# Patient Record
Sex: Female | Born: 1995 | Race: Black or African American | Hispanic: No | Marital: Married | Smoking: Never smoker
Health system: Southern US, Community
[De-identification: ages and names within clinical notes are randomized; demographics above are authoritative.]

---
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
## 2016-07-03 ENCOUNTER — Encounter (HOSPITAL_COMMUNITY): Payer: Self-pay

## 2016-07-03 ENCOUNTER — Emergency Department (HOSPITAL_COMMUNITY)
Admission: EM | Admit: 2016-07-03 | Discharge: 2016-07-03 | Disposition: A | Discharge status: Home / Self Care | Payer: Medicaid Other | Attending: Emergency Medicine | Admitting: Emergency Medicine

## 2016-07-03 DIAGNOSIS — Z79899 Other long term (current) drug therapy: Secondary | ICD-10-CM | POA: Insufficient documentation

## 2016-07-03 DIAGNOSIS — O219 Vomiting of pregnancy, unspecified: Secondary | ICD-10-CM | POA: Diagnosis not present

## 2016-07-03 DIAGNOSIS — O0281 Inappropriate change in quantitative human chorionic gonadotropin (hCG) in early pregnancy: Secondary | ICD-10-CM | POA: Insufficient documentation

## 2016-07-03 DIAGNOSIS — Z3A01 Less than 8 weeks gestation of pregnancy: Secondary | ICD-10-CM | POA: Diagnosis not present

## 2016-07-03 DIAGNOSIS — Z3491 Encounter for supervision of normal pregnancy, unspecified, first trimester: Secondary | ICD-10-CM

## 2016-07-03 LAB — HCG, QUANTITATIVE, PREGNANCY: HCG, BETA CHAIN, QUANT, S: 164335 m[IU]/mL — AB (ref <5)

## 2016-07-03 MED ORDER — DOXYLAMINE-PYRIDOXINE 10-10 MG PO TBEC: 1.0000 | DELAYED_RELEASE_TABLET | Freq: Two times a day (BID) | ORAL | 0 refills | Status: AC | PRN

## 2016-07-03 MED ORDER — PRENATAL COMPLETE 14-0.4 MG PO TABS: 1.0000 | ORAL_TABLET | Freq: Every day | ORAL | 3 refills | Status: AC

## 2016-07-03 NOTE — ED Provider Notes (Signed)
[facility]-EMERGENCY DEPT Provider Note   CSN: [id number] Arrival date & time: 07/03/16  1104   By signing my name below, I, Soijett Blue, attest that this documentation has been prepared under the direction and in the presence of Shaune PollackIsaacs, Williom Cedar, MD. Electronically Signed: Soijett Blue, ED Scribe. 07/03/16. 12:36 PM.  History   Chief Complaint Chief Complaint  Patient presents with  . Threatened Miscarriage    HPI Sierra Welch is a 21 y.o. female who presents to the Emergency Department complaining of pregnancy issues onset 1 week ago. Pt reports associated resolved nausea and resolved vomiting. Pt has not tried any medications for the relief of her symptoms. She reports that she normally has irregular non-painful periods and began OCP on 05/08/2016 with no menstrual cycle at the end of her OCP pack. Pt states that this is her first time taking OCP and she took 2 home pregnancy test that resulted positive approximately 1 month ago. She states that at 8 weeks she went to [facility] and inconclusive US results and that this is her first pregnancy. She notes that she should be approximately [redacted] weeks pregnant, but she is unsure if she is still pregnant due to having no pregnancy symptoms at this time. 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 She denies abdominal pain, vaginal bleeding, fever, chills, and any other symptoms.     The history is provided by the patient and a significant other. No language interpreter was used.    History reviewed. No pertinent past medical history.  There are no active problems to display for this patient.   History reviewed. No pertinent surgical history.  OB History    Gravida Para Term Preterm AB Living   1             SAB TAB Ectopic Multiple Live Births                   Home Medications    Prior to Admission medications   Medication Sig Start Date End Date Taking? Authorizing Provider    Doxylamine-Pyridoxine 10-10 MG TBEC Take 1 tablet by mouth 2 (two) times daily as needed (nausea, vomiting). 07/03/16   Shaune PollackIsaacs, Craige Patel, MD  Prenatal Vit-Fe Fumarate-FA (PRENATAL COMPLETE) 14-0.4 MG TABS Take 1 tablet by mouth daily. 07/03/16   Shaune PollackIsaacs, Kaelynne Christley, MD    Family History History reviewed. No pertinent family history.  Social History Social History  Substance Use Topics  . Smoking status: Never Smoker  . Smokeless tobacco: Never Used  . Alcohol use No     Allergies   Patient has no known allergies.   Review of Systems Review of Systems  Constitutional: Negative for chills and fever.  Gastrointestinal: Positive for nausea (resolved) and vomiting (resolved). Negative for abdominal pain.  Genitourinary: Negative for vaginal bleeding.  Psychiatric/Behavioral: Decreased concentration: .scribe.  All other systems reviewed and are negative.    Physical Exam Updated Vital Signs BP 114/78 (BP Location: Left Arm)   Pulse (!) 112   Resp 20   LMP 04/24/2016   SpO2 100%   Physical Exam  Constitutional: She is oriented to person, place, and time. She appears well-developed and well-nourished. No distress.  HENT:  Head: Normocephalic and atraumatic.  Eyes: EOM are normal.  Neck: Neck supple.  Cardiovascular: Normal rate, regular rhythm and normal heart sounds.  Exam reveals no gallop and no friction rub.   No murmur heard. Pulmonary/Chest: Effort normal and breath sounds  normal. No respiratory distress. She has no wheezes. She has no rales.  Abdominal: Soft. She exhibits no distension. There is no tenderness.  Musculoskeletal: Normal range of motion.  Neurological: She is alert and oriented to person, place, and time.  Skin: Skin is warm and dry.  Psychiatric: She has a normal mood and affect. Her behavior is normal.  Nursing note and vitals reviewed.    ED Treatments / Results   DIAGNOSTIC STUDIES: Oxygen Saturation is 100% on RA, nl by my interpretation.     COORDINATION OF CARE: 12:35 PM Discussed treatment plan with pt at bedside which includes bedside US and pt agreed to plan.   Labs (all labs ordered are listed, but only abnormal results are displayed) Labs Reviewed  HCG, QUANTITATIVE, PREGNANCY - Abnormal; Notable for the following:       Result Value   hCG, Beta Chain, Quant, Kathie Rhodes [phone] (*)    All other components within normal limits    EKG  EKG Interpretation None       Radiology No results found.  Procedures [place] bedside Date/Time: 07/03/2016 12:25 PM Performed by: Shaune Pollack Authorized by: Shaune Pollack  Consent: Verbal consent obtained. Risks and benefits: risks, benefits and alternatives were discussed Consent given by: patient Patient understanding: patient states understanding of the procedure being performed Relevant documents: relevant documents present and verified Patient identity confirmed: verbally with patient and hospital-assigned identification number Time out: Immediately prior to procedure a "time out" was called to verify the correct patient, procedure, equipment, support staff and site/side marked as required. Local anesthesia used: no  Anesthesia: Local anesthesia used: no  Sedation: Patient sedated: no Patient tolerance: Patient tolerated the procedure well with no immediate complications    (including critical care time)  Medications Ordered in ED Medications - No data to display   Initial Impression / Assessment and Plan / ED Course  I have reviewed the triage vital signs and the nursing notes.  Pertinent labs & imaging results that were available during my care of the patient were reviewed by me and considered in my medical decision making (see chart for details).      21 year old female here to assess whether she is still pregnant. She believes she is [redacted] weeks pregnant based on ultrasound performed by [facility]. No abdominal pain, vaginal bleeding, or vaginal  discharge. On my exam, abdomen is soft and nontender. I suspect her resolution of nausea and vomiting is secondary to progression through the first trimester. She does not appear dehydrated. Bedside ultrasound confirms positive fetal heart tones and given absence of any abdominal pain, bleeding, or other evidence of pregnancy-related competition, do not feel further workup is indicated. I discussed the natural progression of pregnancy with the patient will discharge home.  Final Clinical Impressions(s) / ED Diagnoses   Final diagnoses:  First trimester pregnancy    New Prescriptions Discharge Medication List as of 07/03/2016 12:49 PM    START taking these medications   Details  Doxylamine-Pyridoxine 10-10 MG TBEC Take 1 tablet by mouth 2 (two) times daily as needed (nausea, vomiting)., Starting Tue 07/03/2016, Print    Prenatal Vit-Fe Fumarate-FA (PRENATAL COMPLETE) 14-0.4 MG TABS Take 1 tablet by mouth daily., Starting Tue 07/03/2016, Print         I personally performed the services described in this documentation, which was scribed in my presence. The recorded information has been reviewed and is accurate.     Shaune Pollack, MD 07/03/16 2131

## 2016-07-03 NOTE — ED Triage Notes (Signed)
Pt states she began taking birth control in April, then missed her period. She took 2 preg test at home which were both positive but is now concerned for miscarriage because her pregnancy symptoms have gone away (morning sickness). Pt denies abd pain or any bleeding.

## 2017-10-20 ENCOUNTER — Emergency Department (HOSPITAL_BASED_OUTPATIENT_CLINIC_OR_DEPARTMENT_OTHER): Payer: 59

## 2017-10-20 ENCOUNTER — Other Ambulatory Visit: Payer: Self-pay

## 2017-10-20 ENCOUNTER — Encounter (HOSPITAL_BASED_OUTPATIENT_CLINIC_OR_DEPARTMENT_OTHER): Payer: Self-pay

## 2017-10-20 ENCOUNTER — Emergency Department (HOSPITAL_BASED_OUTPATIENT_CLINIC_OR_DEPARTMENT_OTHER)
Admission: EM | Admit: 2017-10-20 | Discharge: 2017-10-20 | Disposition: A | Discharge status: Home / Self Care | Payer: 59 | Attending: Emergency Medicine | Admitting: Emergency Medicine

## 2017-10-20 DIAGNOSIS — M25511 Pain in right shoulder: Secondary | ICD-10-CM | POA: Diagnosis not present

## 2017-10-20 DIAGNOSIS — Z79899 Other long term (current) drug therapy: Secondary | ICD-10-CM | POA: Insufficient documentation

## 2017-10-20 DIAGNOSIS — R079 Chest pain, unspecified: Secondary | ICD-10-CM | POA: Insufficient documentation

## 2017-10-20 DIAGNOSIS — M7918 Myalgia, other site: Secondary | ICD-10-CM | POA: Diagnosis not present

## 2017-10-20 MED ORDER — IBUPROFEN 400 MG PO TABS
600.0000 mg | ORAL_TABLET | Freq: Once | ORAL | Status: AC
  Administered 2017-10-20: 600 mg via ORAL
  Filled 2017-10-20: qty 1

## 2017-10-20 NOTE — ED Triage Notes (Signed)
Pt c/o R shoulder and chest pain x3 days. Pt states "it feels like a pulled muscle." Pt reports some associated ShOB.

## 2017-10-20 NOTE — ED Provider Notes (Signed)
[facility] EMERGENCY DEPARTMENT Provider Note   CSN: [id number] Arrival date & time: 10/20/17  1948     History   Chief Complaint Chief Complaint  Patient presents with  . Chest Pain    HPI Sierra Welch is a 22 y.o. female.  HPI 22 year old healthy female presents the emergency department with right anterior chest discomfort and right trapezius and shoulder discomfort that has been constant over the past 3 days.  No known injury or trauma but she does have a 6367-month-old pitbull that she takes on walks and polls consistently at the least.  She states it is difficult to manage him with the leash which she tends to hold in her right hand.  No productive cough.  No fevers or chills.  No shortness of breath.  No unilateral leg swelling.  Symptoms are mild to moderate in severity.  She is tried Tylenol with some improvement but not complete improvement of her symptoms.   History reviewed. No pertinent past medical history.  There are no active problems to display for this patient.   History reviewed. No pertinent surgical history.   OB History    Gravida  1   Para      Term      Preterm      AB      Living        SAB      TAB      Ectopic      Multiple      Live Births               Home Medications    Prior to Admission medications   Medication Sig Start Date End Date Taking? Authorizing Provider  Doxylamine-Pyridoxine 10-10 MG TBEC Take 1 tablet by mouth 2 (two) times daily as needed (nausea, vomiting). 07/03/16   Shaune PollackIsaacs, Cameron, MD  Prenatal Vit-Fe Fumarate-FA (PRENATAL COMPLETE) 14-0.4 MG TABS Take 1 tablet by mouth daily. 07/03/16   Shaune PollackIsaacs, Cameron, MD    Family History No family history on file.  Social History Social History   Tobacco Use  . Smoking status: Never Smoker  . Smokeless tobacco: Never Used  Substance Use Topics  . Alcohol use: No  . Drug use: Never     Allergies   Patient has no known allergies.   Review  of Systems Review of Systems  All other systems reviewed and are negative.    Physical Exam Updated Vital Signs BP 137/74 (BP Location: Left Arm)   Pulse 98   Temp 98.3 F (36.8 C) (Oral)   Resp 18   Ht 5\' 6"  (1.676 m)   Wt 56.7 kg   LMP 10/01/2017   SpO2 100%   Breastfeeding? Unknown   BMI 20.18 kg/m   Physical Exam  Constitutional: She is oriented to person, place, and time. She appears well-developed and well-nourished. No distress.  HENT:  Head: Normocephalic and atraumatic.  Eyes: EOM are normal.  Neck: Normal range of motion.  Cardiovascular: Normal rate, regular rhythm and normal heart sounds.  Pulmonary/Chest: Effort normal and breath sounds normal.  Abdominal: Soft. She exhibits no distension. There is no tenderness.  Musculoskeletal: Normal range of motion.  Full range of motion of right shoulder, right elbow, right wrist.  Normal right radial pulse.  Normal grip strength right hand.  Some tenderness along the right trapezius and scapular region without rash.  No significant anterior chest tenderness.  Neurological: She is alert and oriented to person, place,  and time.  Skin: Skin is warm and dry.  Psychiatric: She has a normal mood and affect. Judgment normal.  Nursing note and vitals reviewed.    ED Treatments / Results  Labs (all labs ordered are listed, but only abnormal results are displayed) Labs Reviewed - No data to display  EKG EKG Interpretation  Date/Time:  Sunday October 20 2017 19:57:21 EDT Ventricular Rate:  109 PR Interval:  136 QRS Duration: 74 QT Interval:  334 QTC Calculation: 449 R Axis:   97 Text Interpretation:  Sinus tachycardia Rightward axis Borderline ECG No old tracing to compare Confirmed by Azalia Bilis ([id number]) on 10/20/2017 9:19:25 PM   Radiology Dg Chest 2 View  Result Date: 10/20/2017 CLINICAL DATA:  Patient with shoulder and chest pain. EXAM: CHEST - 2 VIEW COMPARISON:  None. FINDINGS: Normal cardiac and  mediastinal contours. No consolidative pulmonary opacities. No pleural effusion or pneumothorax. IMPRESSION: No acute cardiopulmonary process. Electronically Signed   By: Annia Belt M.D.   On: 10/20/2017 21:02    Procedures Procedures (including critical care time)  Medications Ordered in ED Medications  ibuprofen (ADVIL,MOTRIN) tablet 600 mg (has no administration in time range)     Initial Impression / Assessment and Plan / ED Course  I have reviewed the triage vital signs and the nursing notes.  Pertinent labs & imaging results that were available during my care of the patient were reviewed by me and considered in my medical decision making (see chart for details).     Pain seems to be more musculoskeletal nature.  EKG without ischemic changes.  Chest x-ray negative.  Doubt PE.  Doubt dissection.  No signs to suggest acute coronary syndrome.  No signs of pneumothorax on chest x-ray.  Recommend ibuprofen and heat for symptoms  Final Clinical Impressions(s) / ED Diagnoses   Final diagnoses:  Chest pain, unspecified type  Musculoskeletal pain    ED Discharge Orders    None       Azalia Bilis, MD 10/20/17 2126

## 2019-03-27 IMAGING — CR DG CHEST 2V
2 series · 2 of 2 positions shown · non-contrast
Comparison: None.

CLINICAL DATA: Patient with shoulder and chest pain.

EXAM:
CHEST - 2 VIEW

[w chest pa]
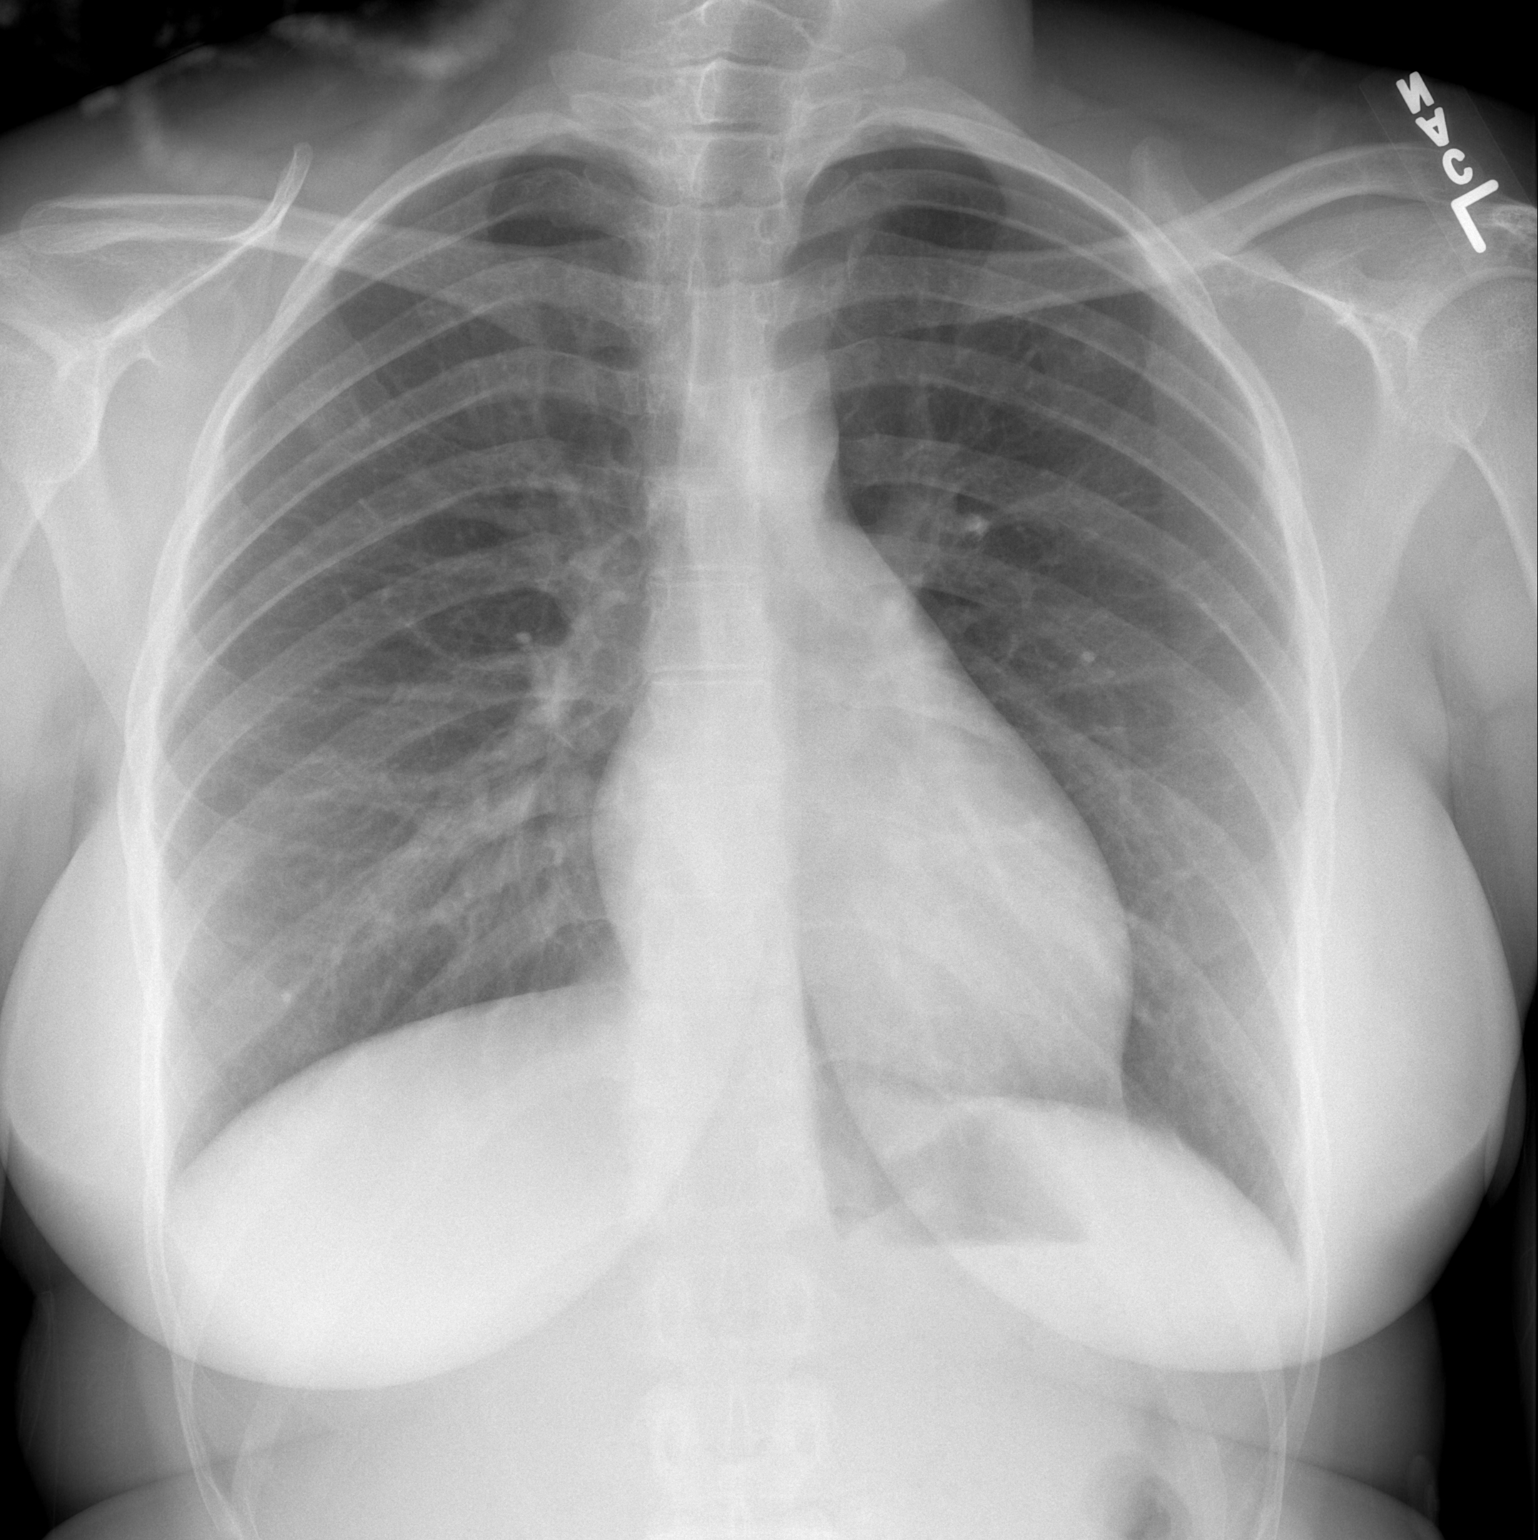

[w chest lat]
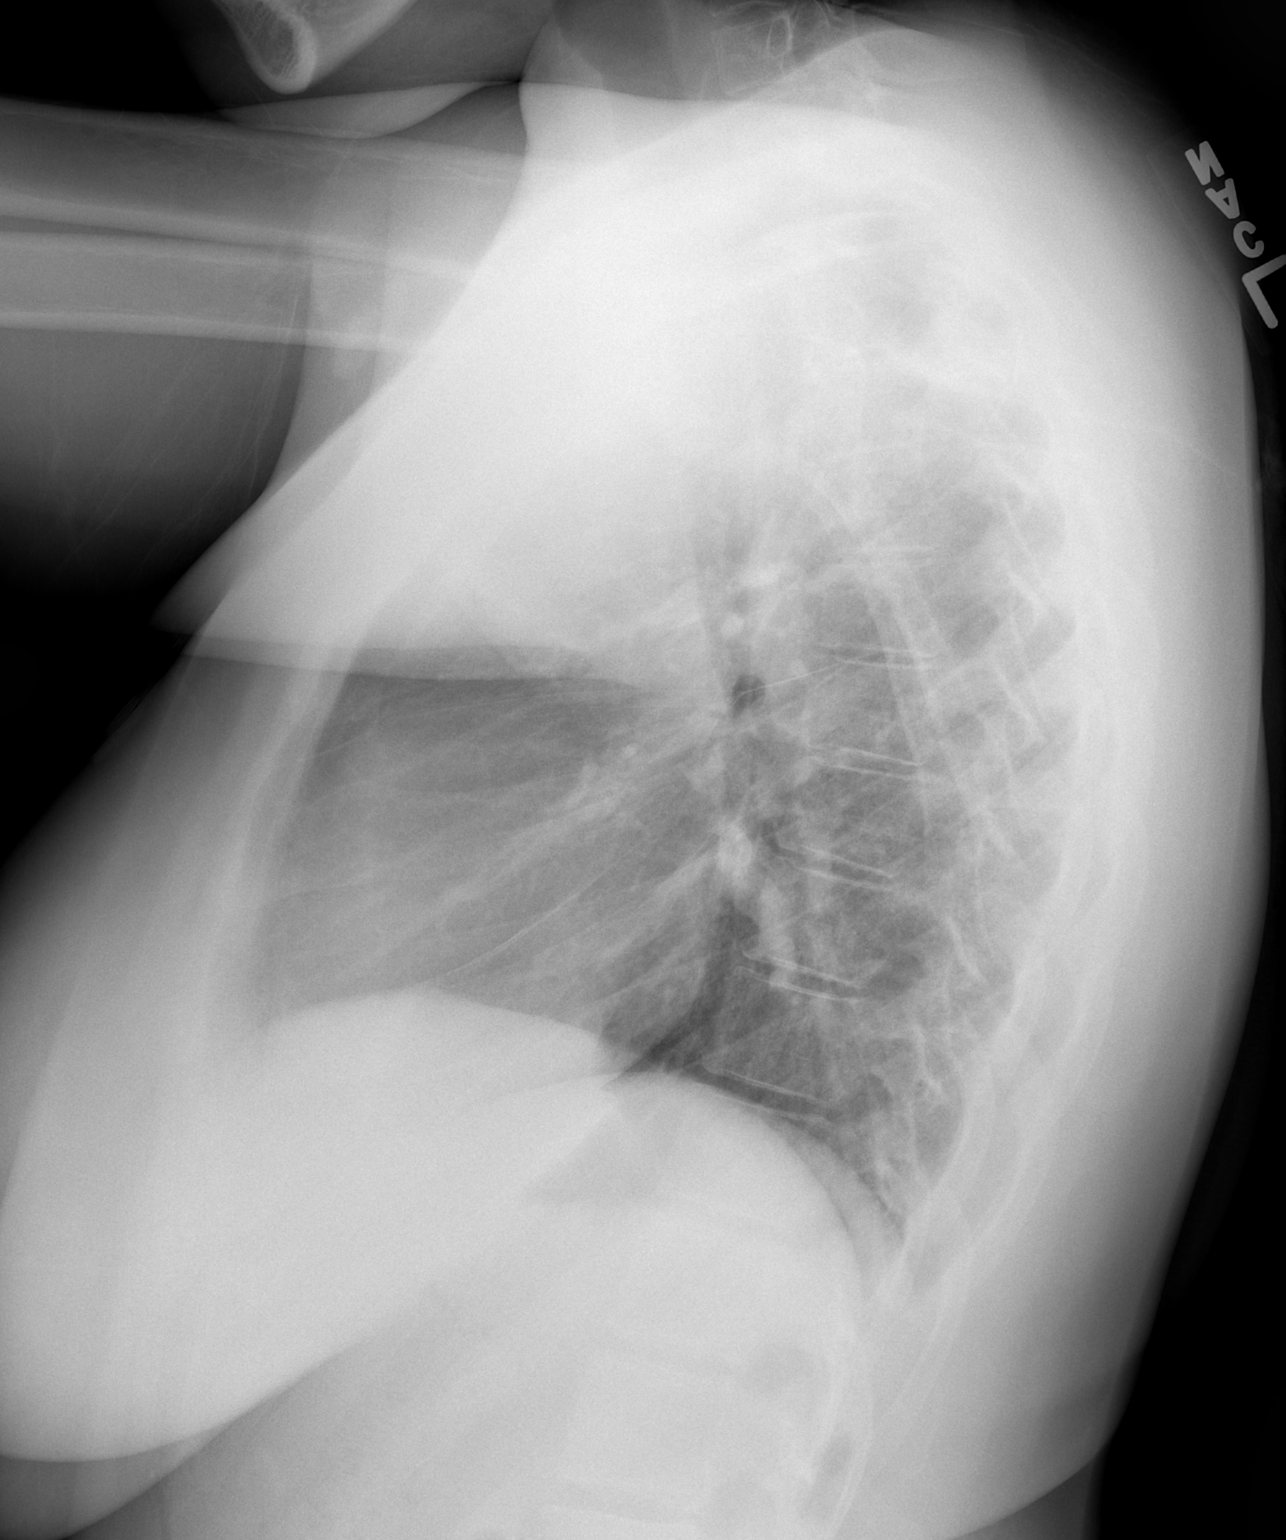

[2 of 2 positions shown; findings below may reference images not displayed]

FINDINGS: Normal cardiac and mediastinal contours. No consolidative pulmonary
opacities. No pleural effusion or pneumothorax.
IMPRESSION: No acute cardiopulmonary process.

## 2019-07-08 ENCOUNTER — Other Ambulatory Visit: Payer: Self-pay

## 2019-07-08 ENCOUNTER — Emergency Department (HOSPITAL_BASED_OUTPATIENT_CLINIC_OR_DEPARTMENT_OTHER)
Admission: EM | Admit: 2019-07-08 | Discharge: 2019-07-08 | Disposition: A | Discharge status: Home / Self Care | Payer: 59 | Attending: Emergency Medicine | Admitting: Emergency Medicine

## 2019-07-08 ENCOUNTER — Encounter (HOSPITAL_BASED_OUTPATIENT_CLINIC_OR_DEPARTMENT_OTHER): Payer: Self-pay

## 2019-07-08 DIAGNOSIS — L509 Urticaria, unspecified: Secondary | ICD-10-CM | POA: Insufficient documentation

## 2019-07-08 DIAGNOSIS — R21 Rash and other nonspecific skin eruption: Secondary | ICD-10-CM | POA: Insufficient documentation

## 2019-07-08 MED ORDER — DEXAMETHASONE 6 MG PO TABS
10.0000 mg | ORAL_TABLET | Freq: Once | ORAL | Status: AC
  Administered 2019-07-08: 10 mg via ORAL
  Filled 2019-07-08: qty 1

## 2019-07-08 NOTE — Discharge Instructions (Signed)
Please return for shortness of breath feeling her throat is closing nausea vomiting diarrhea or feeling that you may pass out.  You can take any of the over-the-counter antihistamines.  The steroid should help you with the response.  Keep an eye on what you are applying to your skin as this could be the cause of your symptoms.  Follow-up with your family doctor.  They may refer you to an allergist.

## 2019-07-08 NOTE — ED Provider Notes (Signed)
[facility] EMERGENCY DEPARTMENT Provider Note   CSN: [id number] Arrival date & time: 07/08/19  1855     History Chief Complaint  Patient presents with  . Urticaria    Sierra Welch is a 24 y.o. female.  24 yo F with a chief complaints of a rash.  This is itchy and localized on her arms and legs.  She moved yesterday and think she came in contact with something that caused her to have this reaction.  She thinks it was a certain to have a blanket that she threw away.  She denies feeling like her throat is closing cough shortness of breath abdominal pain nausea vomiting diarrhea or feeling like she may pass out.  The history is provided by the patient.  Urticaria This is a new problem. The current episode started yesterday. The problem occurs constantly. The problem has not changed since onset.Pertinent negatives include no chest pain, no abdominal pain, no headaches and no shortness of breath. Nothing aggravates the symptoms. Nothing relieves the symptoms. She has tried nothing for the symptoms. The treatment provided no relief.       History reviewed. No pertinent past medical history.  There are no problems to display for this patient.   History reviewed. No pertinent surgical history.   OB History    Gravida  1   Para      Term      Preterm      AB      Living        SAB      TAB      Ectopic      Multiple      Live Births              No family history on file.  Social History   Tobacco Use  . Smoking status: Never Smoker  . Smokeless tobacco: Never Used  Substance Use Topics  . Alcohol use: No  . Drug use: Never    Home Medications Prior to Admission medications   Medication Sig Start Date End Date Taking? Authorizing Provider  Doxylamine-Pyridoxine 10-10 MG TBEC Take 1 tablet by mouth 2 (two) times daily as needed (nausea, vomiting). 07/03/16   Duffy Bruce, MD  Prenatal Vit-Fe Fumarate-FA (PRENATAL COMPLETE) 14-0.4 MG TABS  Take 1 tablet by mouth daily. 07/03/16   Duffy Bruce, MD    Allergies    Patient has no known allergies.  Review of Systems   Review of Systems  Constitutional: Negative for chills and fever.  HENT: Negative for congestion and rhinorrhea.   Eyes: Negative for redness and visual disturbance.  Respiratory: Negative for shortness of breath and wheezing.   Cardiovascular: Negative for chest pain and palpitations.  Gastrointestinal: Negative for abdominal pain, nausea and vomiting.  Genitourinary: Negative for dysuria and urgency.  Musculoskeletal: Negative for arthralgias and myalgias.  Skin: Positive for rash. Negative for pallor and wound.  Neurological: Negative for dizziness and headaches.    Physical Exam Updated Vital Signs BP 129/80 (BP Location: Left Arm)   Pulse 90   Temp 99.3 F (37.4 C) (Oral)   Resp 18   Ht 5\' 5"  (1.651 m)   Wt 75.8 kg   SpO2 100%   BMI 27.79 kg/m   Physical Exam Vitals and nursing note reviewed.  Constitutional:      General: She is not in acute distress.    Appearance: She is well-developed. She is not diaphoretic.  HENT:  Head: Normocephalic and atraumatic.  Eyes:     Pupils: Pupils are equal, round, and reactive to light.  Cardiovascular:     Rate and Rhythm: Normal rate and regular rhythm.     Heart sounds: No murmur. No friction rub. No gallop.   Pulmonary:     Effort: Pulmonary effort is normal.     Breath sounds: No wheezing or rales.  Abdominal:     General: There is no distension.     Palpations: Abdomen is soft.     Tenderness: There is no abdominal tenderness.  Musculoskeletal:        General: No tenderness.     Cervical back: Normal range of motion and neck supple.  Skin:    General: Skin is warm and dry.     Findings: Rash present.     Comments: Hives noted to the bilateral arms sparing the trunk.  Neurological:     Mental Status: She is alert and oriented to person, place, and time.  Psychiatric:         Behavior: Behavior normal.     ED Results / Procedures / Treatments   Labs (all labs ordered are listed, but only abnormal results are displayed) Labs Reviewed - No data to display  EKG None  Radiology No results found.  Procedures Procedures (including critical care time)  Medications Ordered in ED Medications  dexamethasone (DECADRON) tablet 10 mg (has no administration in time range)    ED Course  I have reviewed the triage vital signs and the nursing notes.  Pertinent labs & imaging results that were available during my care of the patient were reviewed by me and considered in my medical decision making (see chart for details).    MDM Rules/Calculators/A&P                      81 y oF with hives on her arms.  Most likely the patient came in contact with something that she can touch as it seems to spare her back in her trunk and other areas that were closed.  We will have her keep an eye on what she touches and how her skin reacts.  Give her 1 dose of Decadron here.  Histamine blockade at home.  PCP follow-up.  7:43 PM:  I have discussed the diagnosis/risks/treatment options with the patient and believe the pt to be eligible for discharge home to follow-up with PCP. We also discussed returning to the ED immediately if new or worsening sx occur. We discussed the sx which are most concerning (e.g., sob, syncope, n/v/d) that necessitate immediate return. Medications administered to the patient during their visit and any new prescriptions provided to the patient are listed below.  Medications given during this visit Medications  dexamethasone (DECADRON) tablet 10 mg (10 mg Oral Given 07/08/19 1941)     The patient appears reasonably screen and/or stabilized for discharge and I doubt any other medical condition or other [facility] requiring further screening, evaluation, or treatment in the ED at this time prior to discharge.   Final Clinical Impression(s) / ED Diagnoses Final  diagnoses:  Hives    Rx / DC Orders ED Discharge Orders    None       Melene Plan, DO 07/08/19 1943

## 2019-07-08 NOTE — ED Notes (Signed)
Pt reports moving & walking under a tree, rash down bilat arms and some on ankles. No meds other than aiti itch cream for relief. NAD. No difficulty speaking/swallowing/breathing.

## 2019-07-08 NOTE — ED Triage Notes (Signed)
Pt c/o scattered hives x today-NAD-steady gait

## 2023-04-16 DIAGNOSIS — H6123 Impacted cerumen, bilateral: Secondary | ICD-10-CM | POA: Diagnosis not present

## 2023-04-16 DIAGNOSIS — Z6824 Body mass index (BMI) 24.0-24.9, adult: Secondary | ICD-10-CM | POA: Diagnosis not present

## 2023-05-21 DIAGNOSIS — Z01419 Encounter for gynecological examination (general) (routine) without abnormal findings: Secondary | ICD-10-CM | POA: Diagnosis not present

## 2023-05-21 DIAGNOSIS — F5104 Psychophysiologic insomnia: Secondary | ICD-10-CM | POA: Diagnosis not present

## 2023-05-21 DIAGNOSIS — Z3040 Encounter for surveillance of contraceptives, unspecified: Secondary | ICD-10-CM | POA: Diagnosis not present

## 2023-09-03 DIAGNOSIS — Z1159 Encounter for screening for other viral diseases: Secondary | ICD-10-CM | POA: Diagnosis not present

## 2023-09-03 DIAGNOSIS — Z0000 Encounter for general adult medical examination without abnormal findings: Secondary | ICD-10-CM | POA: Diagnosis not present

## 2023-09-03 DIAGNOSIS — Z114 Encounter for screening for human immunodeficiency virus [HIV]: Secondary | ICD-10-CM | POA: Diagnosis not present

## 2023-09-03 DIAGNOSIS — R000 Tachycardia, unspecified: Secondary | ICD-10-CM | POA: Diagnosis not present

## 2023-09-03 DIAGNOSIS — Z13220 Encounter for screening for lipoid disorders: Secondary | ICD-10-CM | POA: Diagnosis not present

## 2023-09-10 ENCOUNTER — Other Ambulatory Visit: Payer: Self-pay | Admitting: Family Medicine

## 2023-09-10 DIAGNOSIS — N6310 Unspecified lump in the right breast, unspecified quadrant: Secondary | ICD-10-CM

## 2024-08-25 ENCOUNTER — Ambulatory Visit: Admitting: Physician Assistant
